# Patient Record
Sex: Female | Born: 1960 | Race: Black or African American | Hispanic: No | Smoking: Current every day smoker
Health system: Southern US, Community
[De-identification: ages and names within clinical notes are randomized; demographics above are authoritative.]

---

## 2006-04-26 ENCOUNTER — Ambulatory Visit: Payer: Self-pay | Admitting: Family Medicine

## 2006-04-26 ENCOUNTER — Ambulatory Visit: Payer: Self-pay | Admitting: *Deleted

## 2006-05-21 ENCOUNTER — Ambulatory Visit: Payer: Self-pay | Admitting: Family Medicine

## 2006-06-11 ENCOUNTER — Ambulatory Visit: Payer: Self-pay | Admitting: Family Medicine

## 2006-06-12 ENCOUNTER — Ambulatory Visit (HOSPITAL_COMMUNITY): Admission: RE | Admit: 2006-06-12 | Discharge: 2006-06-12 | Payer: Self-pay | Admitting: Internal Medicine

## 2006-06-25 ENCOUNTER — Ambulatory Visit: Payer: Self-pay | Admitting: Family Medicine

## 2006-08-13 ENCOUNTER — Ambulatory Visit: Payer: Self-pay | Admitting: Family Medicine

## 2006-10-08 ENCOUNTER — Ambulatory Visit: Payer: Self-pay | Admitting: Family Medicine

## 2006-11-18 ENCOUNTER — Ambulatory Visit: Payer: Self-pay | Admitting: Family Medicine

## 2007-03-16 ENCOUNTER — Emergency Department (HOSPITAL_COMMUNITY): Admission: EM | Admit: 2007-03-16 | Discharge: 2007-03-16 | Payer: Self-pay | Admitting: Emergency Medicine

## 2007-09-03 ENCOUNTER — Encounter (INDEPENDENT_AMBULATORY_CARE_PROVIDER_SITE_OTHER): Payer: Self-pay | Admitting: *Deleted

## 2008-03-06 IMAGING — CR DG CHEST 2V
2 series · 2 of 2 positions shown · non-contrast
Comparison: None

CLINICAL DATA: Flu symptoms, cough, fever, wheezing.        
 CHEST - 2 VIEW:

[w chest pa]
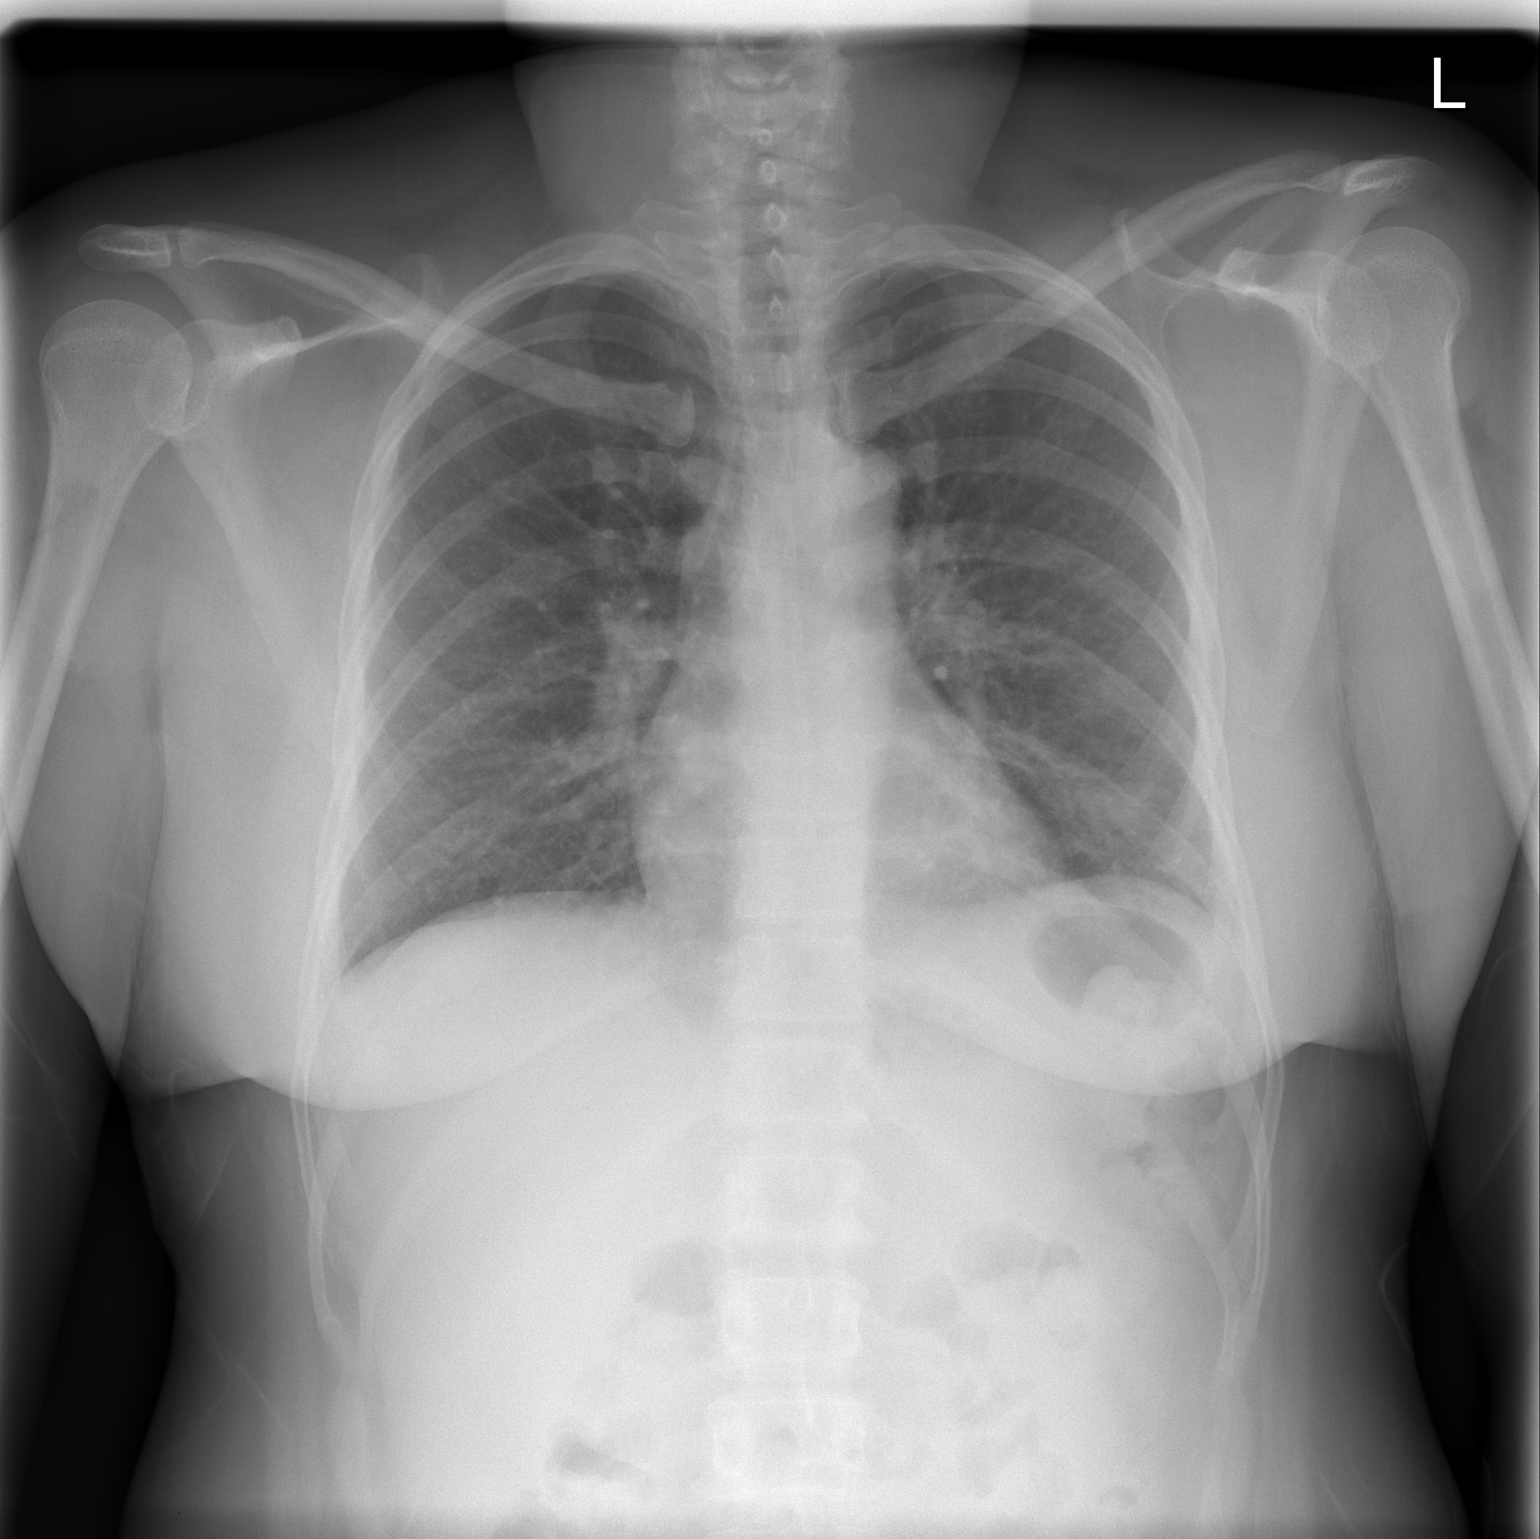

[w chest lat]
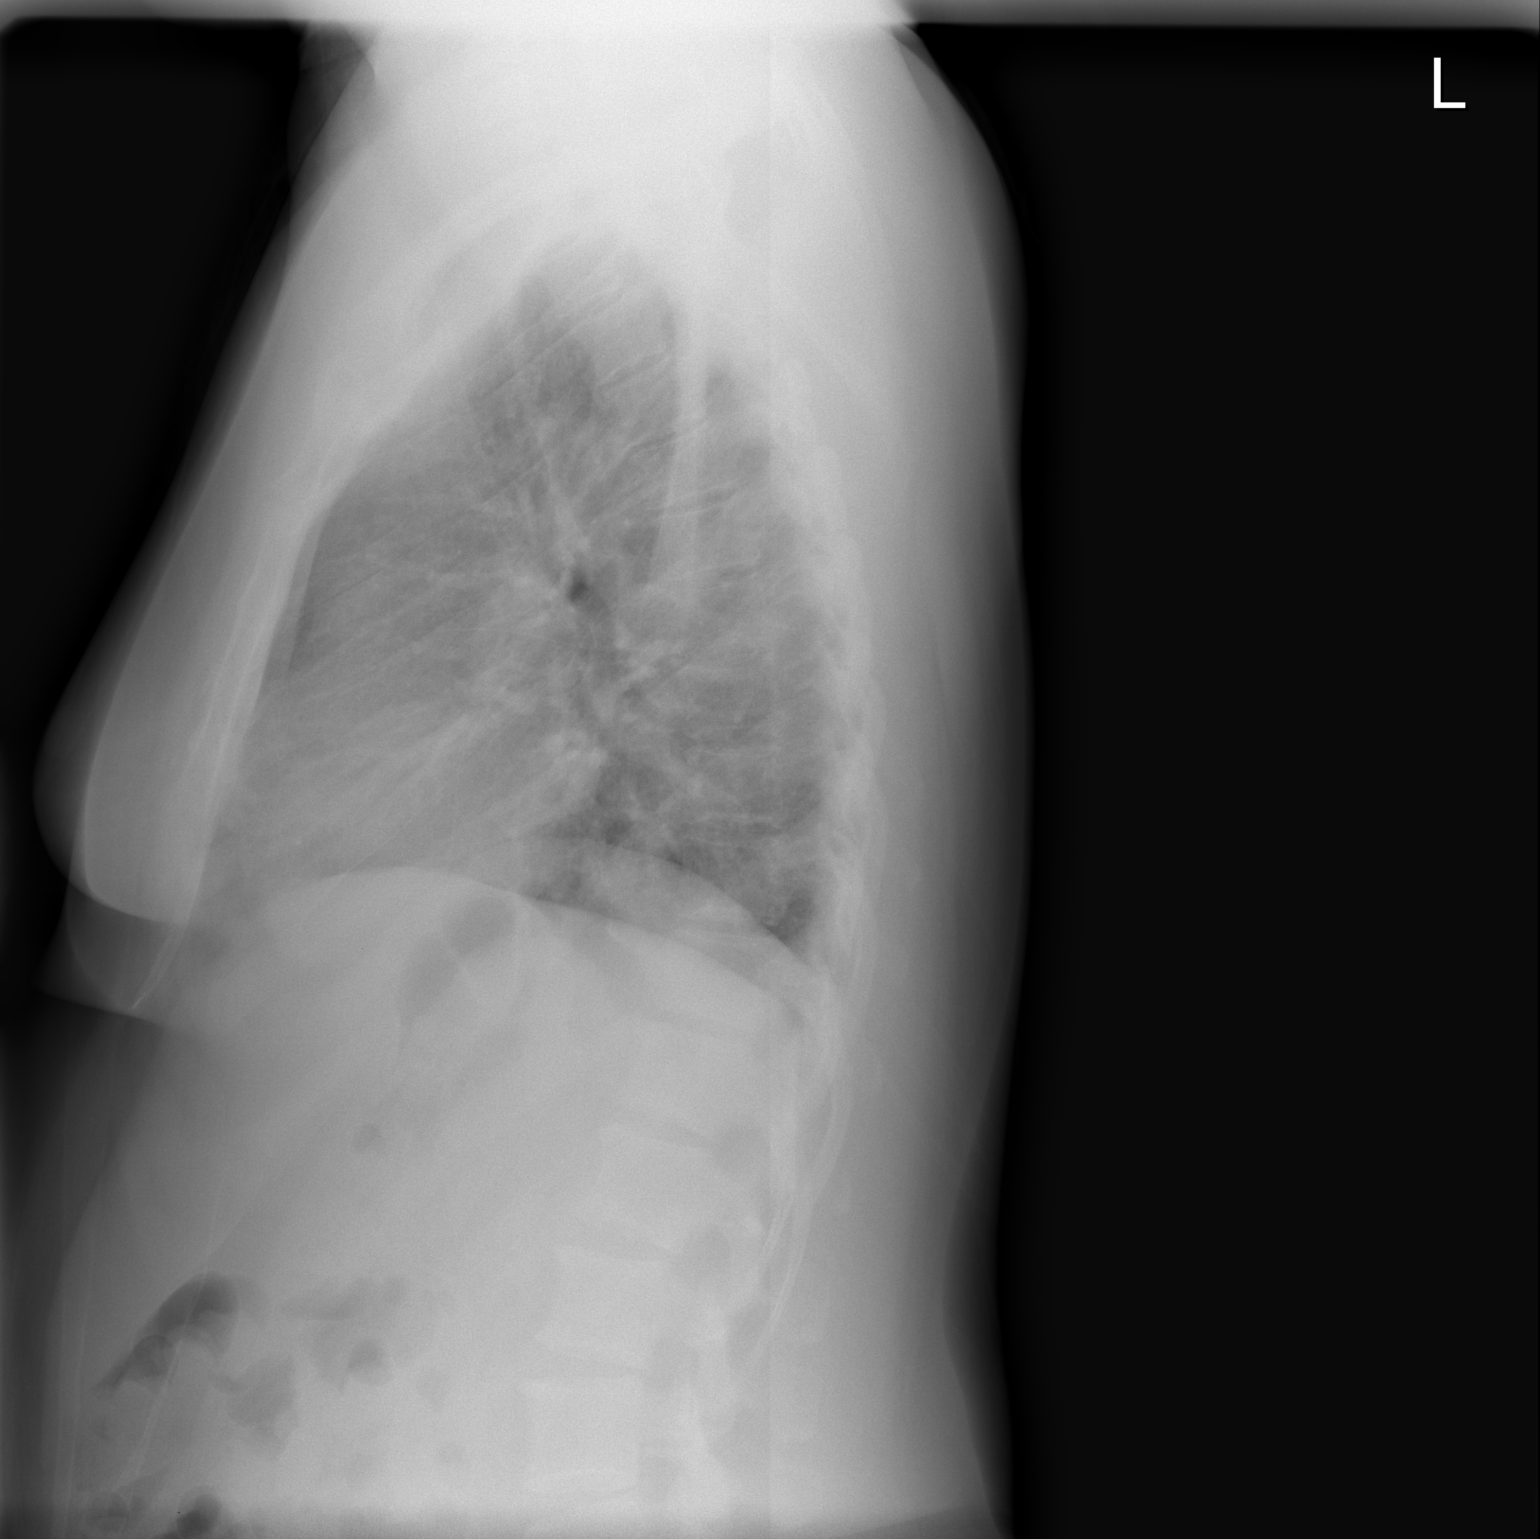

[2 of 2 positions shown; findings below may reference images not displayed]

FINDINGS: Mild accentuation peribronchial markings without central airways thickening.  No active infiltrate.  Normal cardiomediastinal silhouette size and contours.
IMPRESSION: Chronic bronchitic changes.  No focal infiltrate.

## 2021-10-11 ENCOUNTER — Other Ambulatory Visit: Payer: Self-pay

## 2021-10-11 ENCOUNTER — Encounter (HOSPITAL_COMMUNITY): Payer: Self-pay

## 2021-10-11 ENCOUNTER — Ambulatory Visit (HOSPITAL_COMMUNITY)
Admission: EM | Admit: 2021-10-11 | Discharge: 2021-10-11 | Disposition: A | Discharge status: Home / Self Care | Payer: Medicare Other

## 2021-10-11 DIAGNOSIS — J441 Chronic obstructive pulmonary disease with (acute) exacerbation: Secondary | ICD-10-CM | POA: Diagnosis not present

## 2021-10-11 DIAGNOSIS — J0111 Acute recurrent frontal sinusitis: Secondary | ICD-10-CM | POA: Diagnosis not present

## 2021-10-11 MED ORDER — ALBUTEROL SULFATE HFA 108 (90 BASE) MCG/ACT IN AERS: 1.0000 | INHALATION_SPRAY | Freq: Four times a day (QID) | RESPIRATORY_TRACT | 0 refills | Status: AC | PRN

## 2021-10-11 MED ORDER — AMOXICILLIN-POT CLAVULANATE 875-125 MG PO TABS: 1.0000 | ORAL_TABLET | Freq: Two times a day (BID) | ORAL | 0 refills | Status: AC

## 2021-10-11 MED ORDER — DEXAMETHASONE SODIUM PHOSPHATE 10 MG/ML IJ SOLN
10.0000 mg | Freq: Once | INTRAMUSCULAR | Status: AC
  Administered 2021-10-11: 10 mg via INTRAMUSCULAR

## 2021-10-11 MED ORDER — DEXAMETHASONE SODIUM PHOSPHATE 10 MG/ML IJ SOLN
INTRAMUSCULAR | Status: AC
  Filled 2021-10-11: qty 1

## 2021-10-11 MED ORDER — PROMETHAZINE-DM 6.25-15 MG/5ML PO SYRP: 5.0000 mL | ORAL_SOLUTION | Freq: Four times a day (QID) | ORAL | 0 refills | Status: AC | PRN

## 2021-10-11 MED ORDER — GUAIFENESIN ER 600 MG PO TB12: 600.0000 mg | ORAL_TABLET | Freq: Two times a day (BID) | ORAL | 0 refills | Status: AC

## 2021-10-11 NOTE — ED Triage Notes (Signed)
Pt c/o productive cough with green sputum with pink tinge, nasal/chest congestion, fever, and body aches x1wk.

## 2021-10-11 NOTE — ED Provider Notes (Signed)
[facility]    CSN: [id number] Arrival date & time: 10/11/21  8416      History   Chief Complaint Chief Complaint  Patient presents with   Cough    HPI Breanna Adams is a 60 y.o. female.   Patient presenting today with over a week of hacking productive cough of green and pink sputum, thick nasal congestion, fever, body aches, chills, fatigue.  Symptoms have significantly worsened over the last day or so.  She denies chest pain, significant shortness of breath, abdominal pain, nausea vomiting or diarrhea.  No known sick contacts recently.  Trying over-the-counter cold and congestion medications with no relief.  History of asthma as a child and COPD.  Not on any inhalers for this.   Past Medical History:  Diagnosis Date   Diabetes mellitus without complication (HCC)    Hypertension     There are no problems to display for this patient.   History reviewed. No pertinent surgical history.  OB History   No obstetric history on file.      Home Medications    Prior to Admission medications   Medication Sig Start Date End Date Taking? Authorizing Provider  albuterol (VENTOLIN HFA) 108 (90 Base) MCG/ACT inhaler Inhale 1-2 puffs into the lungs every 6 (six) hours as needed for wheezing or shortness of breath. 10/11/21  Yes Particia Nearing, PA-C  amoxicillin-clavulanate (AUGMENTIN) 875-125 MG tablet Take 1 tablet by mouth every 12 (twelve) hours. 10/11/21  Yes Particia Nearing, PA-C  anastrozole (ARIMIDEX) 1 MG tablet Take 1 tablet by mouth daily. 11/15/20  Yes [provider]  diclofenac Sodium (VOLTAREN) 1 % GEL Apply topically. 08/23/21  Yes [provider]  dicyclomine (BENTYL) 10 MG capsule Take by mouth. 10/20/19  Yes [provider]  ergocalciferol (VITAMIN D2) 1.25 MG (50000 UT) capsule Take 1 capsule by mouth once a week. 03/11/20  Yes [provider]  FLUoxetine (PROZAC) 20 MG capsule  10/31/20  Yes [provider]  guaiFENesin (MUCINEX) 600 MG 12 hr tablet Take 1 tablet (600 mg total) by mouth 2 (two) times daily. 10/11/21  Yes Particia Nearing, PA-C  lisinopril (ZESTRIL) 20 MG tablet Take 1 tablet by mouth daily. 01/27/20  Yes [provider]  meloxicam (MOBIC) 15 MG tablet Take 1 tablet by mouth daily. 07/07/20  Yes [provider]  metFORMIN (GLUCOPHAGE) 500 MG tablet Take by mouth. 03/07/20  Yes [provider]  omeprazole (PRILOSEC) 40 MG capsule Take 1 capsule by mouth 2 (two) times daily. 07/25/20  Yes [provider]  perphenazine (TRILAFON) 2 MG tablet Take by mouth. 10/07/17  Yes [provider]  promethazine-dextromethorphan (PROMETHAZINE-DM) 6.25-15 MG/5ML syrup Take 5 mLs by mouth 4 (four) times daily as needed for cough. 10/11/21  Yes Particia Nearing, PA-C  rosuvastatin (CRESTOR) 10 MG tablet Take 1 tablet by mouth daily. 03/07/20  Yes [provider]  tiZANidine (ZANAFLEX) 2 MG tablet Take by mouth. 08/23/21  Yes [provider]  acetaminophen (TYLENOL) 500 MG tablet Take by mouth.    [provider]  cyanocobalamin 1000 MCG tablet Take by mouth.    [provider]    Family History History reviewed. No pertinent family history.  Social History Social History   Tobacco Use   Smoking status: Every Day    Types: Cigarettes   Smokeless tobacco: Never  Substance Use Topics   Alcohol use: Not Currently   Drug use: Not Currently  Allergies   Aspirin, Paclitaxel, Shellfish-derived products, Tamoxifen, and Hydrocodone-acetaminophen   Review of Systems Review of Systems Per HPI  Physical Exam Triage Vital Signs ED Triage Vitals  Enc Vitals Group     BP 10/11/21 1052 (!) 166/109     Pulse Rate 10/11/21 1052 63     Resp 10/11/21 1052 18     Temp 10/11/21 1052 99.1 F (37.3 C)     Temp Source 10/11/21 1052 Oral     SpO2 10/11/21 1052 94 %     Weight --      Height --       Head Circumference --      Peak Flow --      Pain Score 10/11/21 1053 8     Pain Loc --      Pain Edu? --      Excl. in GC? --    No data found.  Updated Vital Signs BP (!) 166/109 (BP Location: Left Arm)   Pulse 63   Temp 99.1 F (37.3 C) (Oral)   Resp 18   SpO2 94%   Visual Acuity Right Eye Distance:   Left Eye Distance:   Bilateral Distance:    Right Eye Near:   Left Eye Near:    Bilateral Near:     Physical Exam Vitals and nursing note reviewed.  Constitutional:      Appearance: Normal appearance. She is not ill-appearing.  HENT:     Head: Atraumatic.     Right Ear: Tympanic membrane normal.     Left Ear: Tympanic membrane normal.     Nose: Congestion present.     Mouth/Throat:     Mouth: Mucous membranes are moist.     Pharynx: Posterior oropharyngeal erythema present. No oropharyngeal exudate.  Eyes:     Extraocular Movements: Extraocular movements intact.     Conjunctiva/sclera: Conjunctivae normal.  Cardiovascular:     Rate and Rhythm: Normal rate and regular rhythm.     Heart sounds: Normal heart sounds.  Pulmonary:     Effort: Pulmonary effort is normal.     Breath sounds: Wheezing present. No rales.  Abdominal:     General: Bowel sounds are normal. There is no distension.     Palpations: Abdomen is soft.     Tenderness: There is no abdominal tenderness.  Musculoskeletal:        General: Normal range of motion.     Cervical back: Normal range of motion and neck supple.  Skin:    General: Skin is warm and dry.  Neurological:     Mental Status: She is alert and oriented to person, place, and time.     Motor: No weakness.     Gait: Gait normal.  Psychiatric:        Mood and Affect: Mood normal.        Thought Content: Thought content normal.        Judgment: Judgment normal.     UC Treatments / Results  Labs (all labs ordered are listed, but only abnormal results are displayed) Labs Reviewed - No data to display  EKG   Radiology No  results found.  Procedures Procedures (including critical care time)  Medications Ordered in UC Medications  dexamethasone (DECADRON) injection 10 mg (10 mg Intramuscular Given 10/11/21 1123)    Initial Impression / Assessment and Plan / UC Course  I have reviewed the triage vital signs and the nursing notes.  Pertinent labs & imaging results that were  available during my care of the patient were reviewed by me and considered in my medical decision making (see chart for details).     We will treat with IM Decadron, Augmentin, Mucinex, Phenergan DM, albuterol inhaler for a COPD exacerbation and frontal sinusitis.  Discussed supportive home care and over-the-counter medications additionally.  Return for acutely worsening symptoms.  Final Clinical Impressions(s) / UC Diagnoses   Final diagnoses:  Acute recurrent frontal sinusitis  COPD exacerbation [facility])   Discharge Instructions   None    ED Prescriptions     Medication Sig Dispense Auth. Provider   amoxicillin-clavulanate (AUGMENTIN) 875-125 MG tablet Take 1 tablet by mouth every 12 (twelve) hours. 14 tablet Particia Nearing, [place]   guaiFENesin (MUCINEX) 600 MG 12 hr tablet Take 1 tablet (600 mg total) by mouth 2 (two) times daily. 30 tablet Particia Nearing, [place]   promethazine-dextromethorphan (PROMETHAZINE-DM) 6.25-15 MG/5ML syrup Take 5 mLs by mouth 4 (four) times daily as needed for cough. 100 mL Particia Nearing, PA-C   albuterol (VENTOLIN HFA) 108 (90 Base) MCG/ACT inhaler Inhale 1-2 puffs into the lungs every 6 (six) hours as needed for wheezing or shortness of breath. 18 g Particia Nearing, [place]      PDMP not reviewed this encounter.   Particia Nearing, [place] 10/11/21 1547

## 2021-11-08 ENCOUNTER — Other Ambulatory Visit: Payer: Self-pay | Admitting: Family Medicine

## 2021-11-08 NOTE — Telephone Encounter (Signed)
Provider no longer at practice Requested Prescriptions  Refused Prescriptions Disp Refills  . albuterol (VENTOLIN HFA) 108 (90 Base) MCG/ACT inhaler [Pharmacy Med Name: ALBUTEROL HFA INH(200 [id number]] 18 g 0    Sig: INHALE 1 TO 2 PUFFS INTO THE LUNGS EVERY 6 HOURS AS NEEDED FOR WHEEZING OR SHORTNESS OF BREATH     Pulmonology:  Beta Agonists Failed - 11/08/2021 10:02 AM      Failed - One inhaler should last at least one month. If the patient is requesting refills earlier, contact the patient to check for uncontrolled symptoms.      Failed - Valid encounter within last 12 months    Recent Outpatient Visits   None

## 2022-11-27 DIAGNOSIS — Z23 Encounter for immunization: Secondary | ICD-10-CM

## 2022-11-27 NOTE — Progress Notes (Signed)
Patient received 0.5 ml IM Injection in Left Deltoid, Lot# [id number], Exp date: 06.30.2024. Patient feeling well, with no adverse reactions.

## 2023-05-21 LAB — AMB RESULTS CONSOLE CBG: Glucose: 109
